# Patient Record
Sex: Male | Born: 1960 | Race: Black or African American | Hispanic: No | Marital: Married | Smoking: Current every day smoker
Health system: Southern US, Community
[De-identification: ages and names within clinical notes are randomized; demographics above are authoritative.]

## PROBLEM LIST (undated history)

## (undated) DIAGNOSIS — I10 Essential (primary) hypertension: Secondary | ICD-10-CM

## (undated) DIAGNOSIS — J449 Chronic obstructive pulmonary disease, unspecified: Secondary | ICD-10-CM

## (undated) DIAGNOSIS — E079 Disorder of thyroid, unspecified: Secondary | ICD-10-CM

## (undated) DIAGNOSIS — E785 Hyperlipidemia, unspecified: Secondary | ICD-10-CM

## (undated) DIAGNOSIS — H269 Unspecified cataract: Secondary | ICD-10-CM

## (undated) DIAGNOSIS — H409 Unspecified glaucoma: Secondary | ICD-10-CM

## (undated) HISTORY — DX: Unspecified glaucoma: H40.9

## (undated) HISTORY — DX: Unspecified cataract: H26.9

## (undated) HISTORY — DX: Hyperlipidemia, unspecified: E78.5

## (undated) HISTORY — DX: Chronic obstructive pulmonary disease, unspecified: J44.9

## (undated) HISTORY — PX: EYE SURGERY: SHX253

---

## 2002-05-20 ENCOUNTER — Emergency Department (HOSPITAL_COMMUNITY): Admission: EM | Admit: 2002-05-20 | Discharge: 2002-05-20 | Payer: Self-pay | Admitting: Emergency Medicine

## 2003-09-18 ENCOUNTER — Ambulatory Visit (HOSPITAL_COMMUNITY): Admission: RE | Admit: 2003-09-18 | Discharge: 2003-09-18 | Payer: Self-pay | Admitting: Ophthalmology

## 2004-08-23 ENCOUNTER — Encounter: Payer: Self-pay | Admitting: Cardiology

## 2004-08-23 ENCOUNTER — Inpatient Hospital Stay (HOSPITAL_COMMUNITY): Admission: EM | Admit: 2004-08-23 | Discharge: 2004-08-26 | Payer: Self-pay | Admitting: *Deleted

## 2004-08-23 ENCOUNTER — Ambulatory Visit: Payer: Self-pay | Admitting: "Endocrinology

## 2004-08-29 ENCOUNTER — Ambulatory Visit: Payer: Self-pay | Admitting: "Endocrinology

## 2004-09-03 ENCOUNTER — Ambulatory Visit: Payer: Self-pay | Admitting: "Endocrinology

## 2004-09-10 ENCOUNTER — Ambulatory Visit (HOSPITAL_COMMUNITY): Admission: RE | Admit: 2004-09-10 | Discharge: 2004-09-10 | Payer: Self-pay | Admitting: "Endocrinology

## 2004-09-19 ENCOUNTER — Ambulatory Visit: Payer: Self-pay | Admitting: "Endocrinology

## 2004-10-21 ENCOUNTER — Ambulatory Visit: Payer: Self-pay | Admitting: "Endocrinology

## 2004-12-23 ENCOUNTER — Ambulatory Visit: Payer: Self-pay | Admitting: "Endocrinology

## 2005-04-21 ENCOUNTER — Ambulatory Visit: Payer: Self-pay | Admitting: "Endocrinology

## 2005-08-19 ENCOUNTER — Ambulatory Visit: Payer: Self-pay | Admitting: "Endocrinology

## 2005-11-19 ENCOUNTER — Ambulatory Visit: Payer: Self-pay | Admitting: "Endocrinology

## 2006-04-08 ENCOUNTER — Ambulatory Visit: Payer: Self-pay | Admitting: "Endocrinology

## 2006-06-27 IMAGING — US US ABDOMEN COMPLETE
1 series · 14 of 25 positions shown · non-contrast
Comparison: None.

CLINICAL DATA: Elevated liver function tests.

ABDOMEN ULTRASOUND
TECHNIQUE: Complete abdominal ultrasound examination was performed including
evaluation of the liver, gallbladder, bile ducts, pancreas, kidneys, spleen,
IVC, and abdominal aorta.

[Series 1: unknown · 0.32mm/px · 14 of 88 slices shown]
[im 1/88]
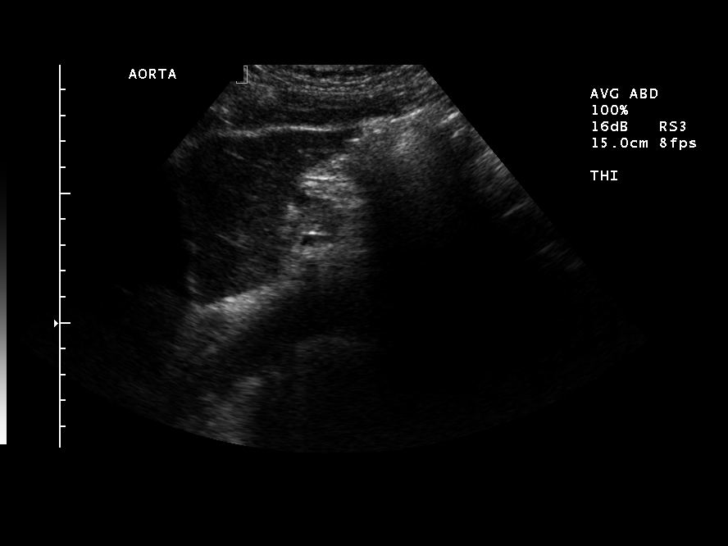
[im 8/88]
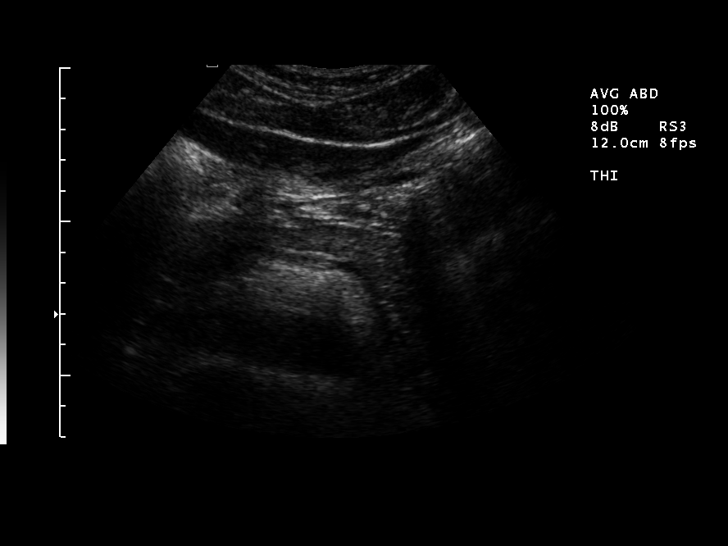
[im 15/88]
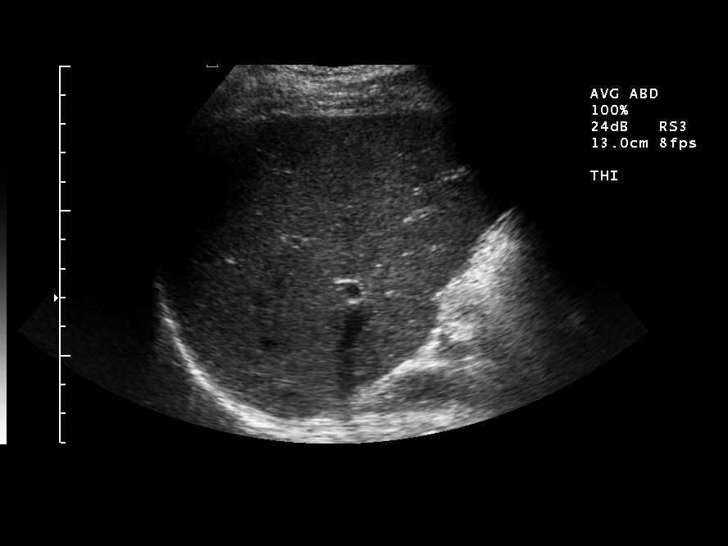
[im 22/88]
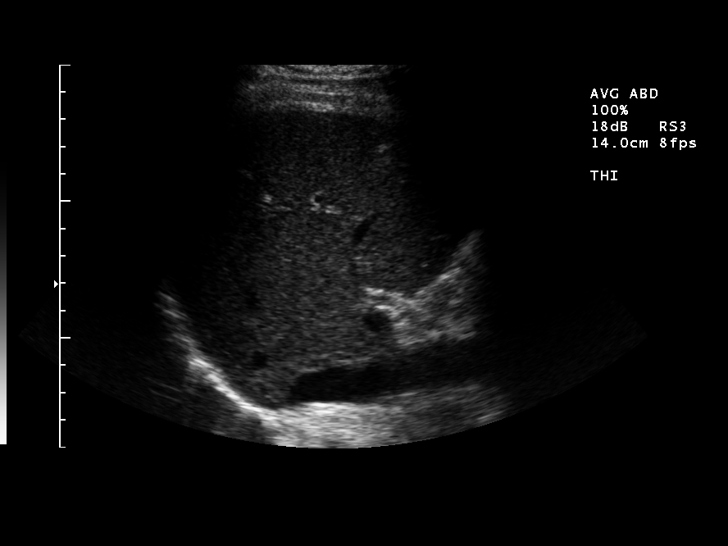
[im 30/88]
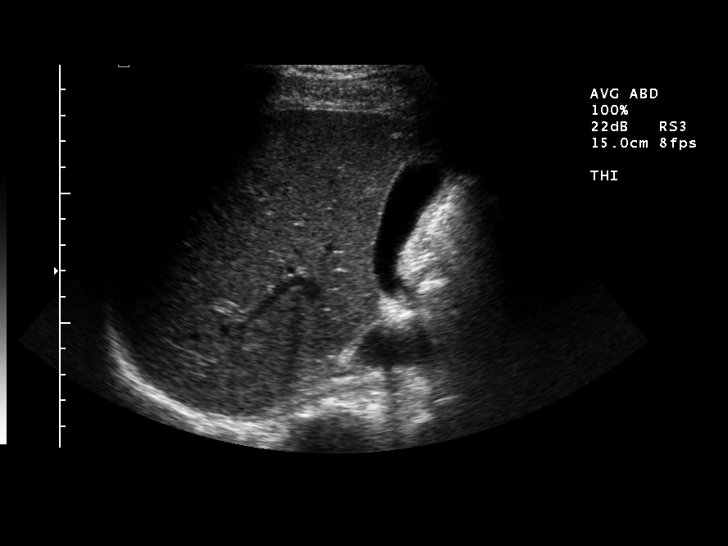
[im 33/88]
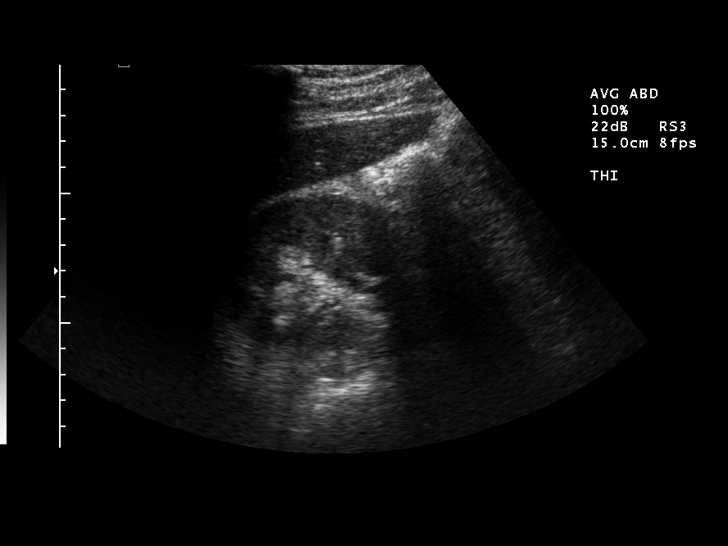
[im 40/88]
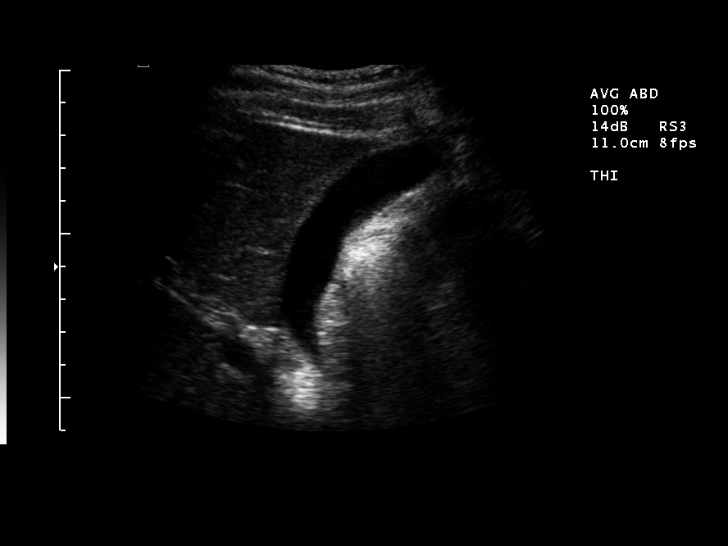
[im 48/88]
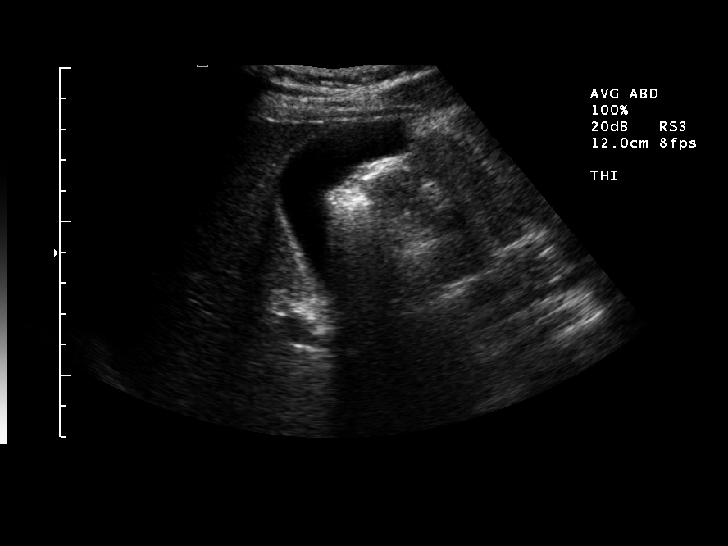
[im 55/88]
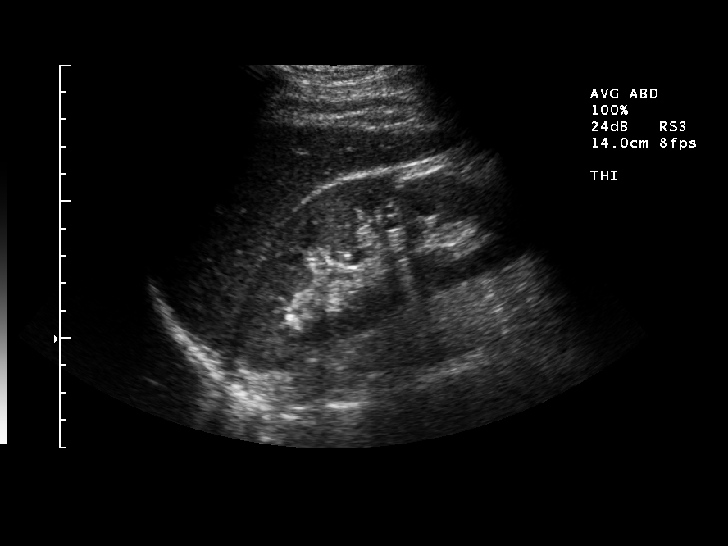
[im 59/88]
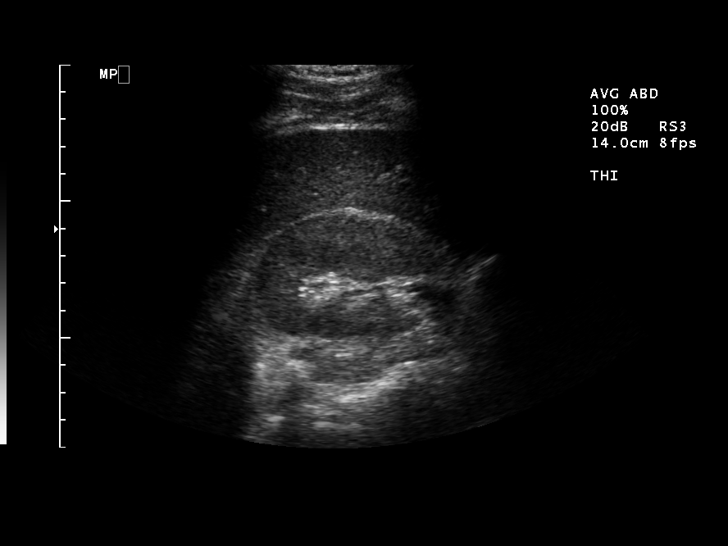
[im 66/88]
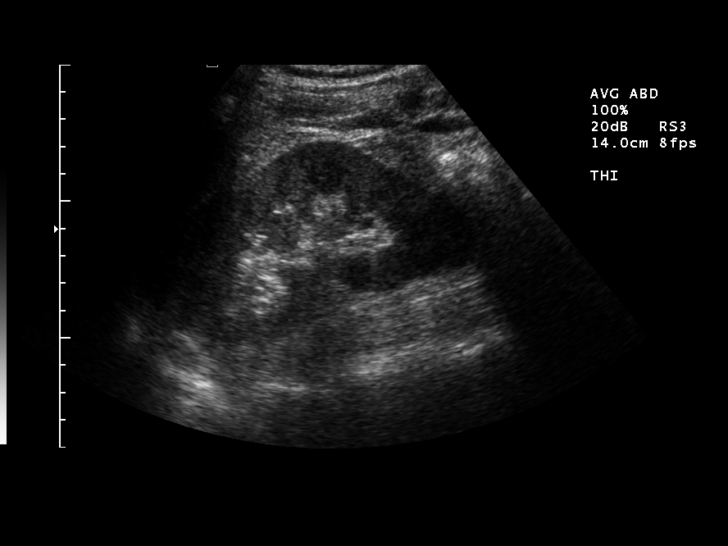
[im 73/88]
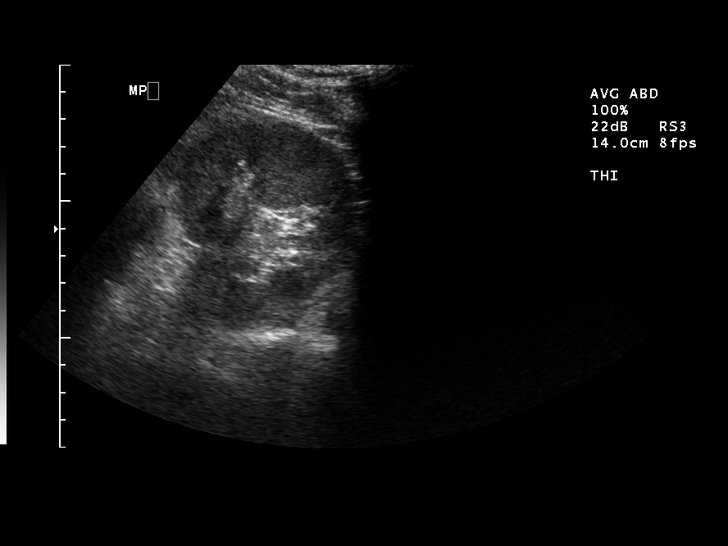
[im 80/88]
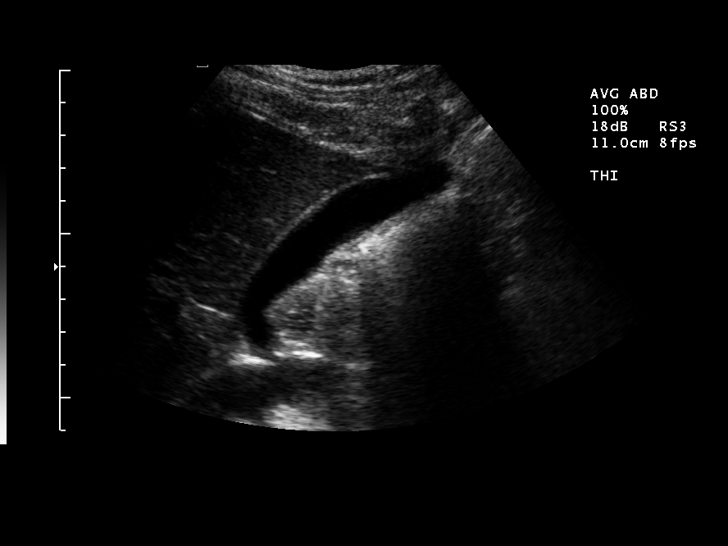
[im 88/88]
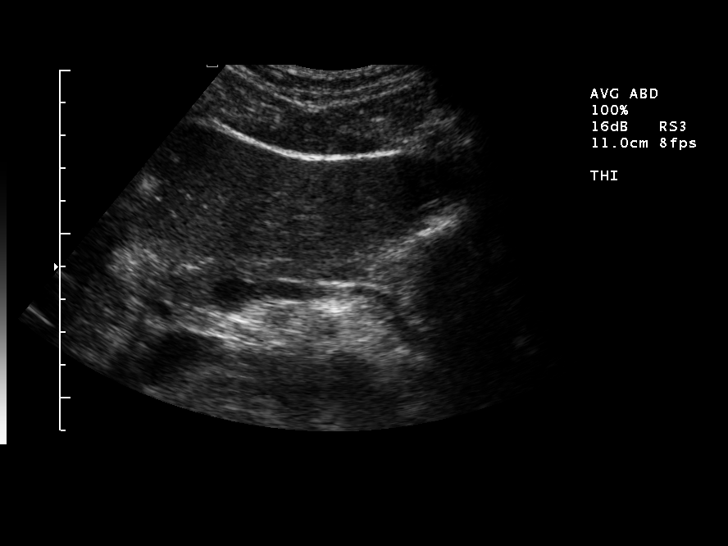

[14 of 25 positions shown; findings below may reference images not displayed]

FINDINGS: Poorly visualized pancreas due to overlying bowel gas. The visualized
portions have normal appearances. Normal appearing liver, gallbladder, spleen,
kidneys, abdominal aorta and inferior vena cava. No gallstones, biliary ductal
dilatation or free peritoneal fluid. The common duct measures 3.3 mm in maximum
diameter.

IMPRESSION

Poor visualization of portions of the pancreas due to overlying bowel gas.
Otherwise, normal examination.

## 2006-12-24 ENCOUNTER — Ambulatory Visit: Payer: Self-pay | Admitting: "Endocrinology

## 2007-06-17 ENCOUNTER — Ambulatory Visit: Payer: Self-pay | Admitting: "Endocrinology

## 2008-10-23 ENCOUNTER — Ambulatory Visit: Payer: Self-pay | Admitting: "Endocrinology

## 2010-10-25 NOTE — H&P (Signed)
NAME:  Troy Newton, SWANDER NO.:  [id number]   MEDICAL RECORD NO.:  [id number]          PATIENT TYPE:  EMS   LOCATION:  ED                           FACILITY:  [facility]   PHYSICIAN:  Melissa L. Ladona Ridgel, MD  DATE OF BIRTH:  1960-08-22   DATE OF ADMISSION:  08/23/2004  DATE OF DISCHARGE:                                HISTORY & PHYSICAL   CHIEF COMPLAINT:  Chest pain.   PRIMARY CARE PHYSICIAN:  [facility].   HISTORY OF PRESENT ILLNESS:  The patient is a 50 year old African-American  male with a recent history of tremor x2 months diagnosed with a high thyroid  according to the patient by [facility] and he was sent to [facility]  Radiology to take a radiation test of which he cannot describe the name. The  patient states around 10 p.m. last night, he was watching television when he  developed the gradual onset of chest discomfort, it was about a 6/10, it was  worse with inspiration, lying back decreased it. He says he can't take deep  breaths because it really hurts when he does that. He says that there is no  radiation of the pain to his arms and no shortness of breath per say but he  just can't take a deep breath. He has had a dry cough for about 3-4 days. He  has had no sick contacts, no nausea, no vomiting.   REVIEW OF SYMPTOMS:  He has had no fever, maybe some chills, but no nausea,  no vomiting, no diarrhea, no musculoskeletal complaints. He has had a tremor  x2, no dysuria, no constipation, all other review of systems are negative.   PAST MEDICAL HISTORY:  What sounds like a thyrotoxicosis. He is taking no  medications at this time although [facility] did give him the nerve pill, he  does not know the name of it. He denies diabetes or hypertension.   PAST SURGICAL HISTORY:  The patient has had glaucoma and required a right  eye surgery.   SOCIAL HISTORY:  He smokes a pack a day. He says he has occasional ethanol  but it sounds like he may be using a little bit  more alcohol than he is  letting on. He states that he has about a shot maybe every couple of days.  He says he has not had any alcohol in two weeks. He said he occasionally  smokes marijuana and denies cocaine use.   FAMILY HISTORY:  He says his brother and sister have thyroid problems. Mom  is dead secondary to an MI, dad is deceased secondary to diabetes.   ALLERGIES:  No known drug allergies.   MEDICATIONS:  He is not taking any medications at this time.   PHYSICAL EXAMINATION:  VITAL SIGNS:  Temperature is 97.8, blood pressure of  150/90, respiratory rate 20, saturations 98%.  GENERAL:  This is a tremulous, well-developed, well-nourished, African-  American male in mild distress secondary to his tremor.  HEENT:  Normocephalic, atraumatic. Pupils round on the left eye but  irregular on the right eye. He has a  history of surgery in that eye. He has  no proptosis. Mucous membranes are moist.  NECK:  Supple. There is a large nodular goiter, left side is greater than  right. He has no bruits.  CHEST:  Clear to auscultation. He has decreased air entry secondary to  decreased inspiration. There is no rhonchi, rales or wheezes.  CARDIOVASCULAR:  He is tachycardic. Positive S1, S2, no S3 or S4. No murmur,  rub or gallop and no pulsus paradoxus is noted.  ABDOMEN:  Soft, minimally tender in the epigastrium. He has positive bowel  sounds and is otherwise nontender.  EXTREMITIES:  Show no cyanosis, clubbing or edema but he does have a tremor.  NEUROLOGIC:  He is hyperreflexive. Cranial nerves II-XII are intact, power  is 5/5, sensation is intact.   LABORATORY DATA:  Reveal a white count of 7.6, hemoglobin of 14.1,  hematocrit is 41.2, platelets of 274.  His sodium is 137, potassium is 4.5,  chloride 106, CO2 is 24, BUN is 11, creatinine 0.7, glucose is 112. AST is  42, ALT is 61, alkaline phosphatase is 141.   Chest x-ray shows bibasilar atelectasis versus pneumonia with low lung   volumes. EKG is 110 with PR depressions. ST elevations in V1, V2, decreased  ST segments in AVR.   ASSESSMENT/PLAN:  This is a 50 year old African-American male who presents  with chest pain, onset gradual since last p.m.  He was recently evaluated  for high thyroid and he states that he took a radiation pill two days ago.  I did get a call off to [facility]. The patient had a  radioactive uptake of 28.6 microcuries of iodine. At 24 hours, he is 48%  consistent with hyperthyroidism.   1.  Cardiovascular tachycardia secondary to thyrotoxicosis. Will start him      on beta blocker after checking his drug screen which has come back at      this time and is negative for cocaine. Will get a stat 2-D echo to rule      out pericarditis and to make sure that there is no signs of tamponade.      At this time clinically, there are no signs of tamponade and we will      titrate up the beta blocker to effect.  2.  Pulmonary.  He has a dry cough. His x-ray has atelectasis secondary to      decreased inspiratory effort. I do not feel that this is pneumonia, at      this time he has not no fever and no white count. I will do incentive      spirometry.  3.  GI.  He has no complaints or issues, will follow him while taking Motrin      to assure that he does not develop any gastrointestinal symptoms.  4.  GU. He has no complaint or issues. Will follow his I's and O's. His      urine drug screen did come back positive for marijuana.  5.  Tobacco abuse. He will get a cessation consult and will use a Nicoderm      patch.  6.  Endocrine, thyrotoxicosis. Will check stat TSH, T3, T4 and start him on      Lopressor 25 mg p.o. q.12 hours and up titrate as needed. He is going to      be on Motrin at this time for symptoms of pericarditis. We may wish to      consider prednisone but  I think at this time the Motrin is appropriate.      I will speak with endocrinology on call to assure that all of his      treatment needs have been met and will arrange for followup as an      outpatient for him.   DVT prophylaxis will be with early ambulation.      MLT/MEDQ  D:  08/23/2004  T:  08/23/2004  Job:  [id number]

## 2010-10-25 NOTE — Op Note (Signed)
NAME:  Troy Newton, Troy Newton NO.:  [id number]   MEDICAL RECORD NO.:  [id number]                   PATIENT TYPE:  OIB   LOCATION:  2899                                 FACILITY:  [facility]   PHYSICIAN:  Salley Scarlet., M.D.         DATE OF BIRTH:  1960/09/13   DATE OF PROCEDURE:  DATE OF DISCHARGE:  09/18/2003                                 OPERATIVE REPORT   PREOPERATIVE DIAGNOSIS:  Glaucoma, poor control, right eye.   POSTOPERATIVE DIAGNOSIS:  Glaucoma, poor control, right eye.   OPERATION:  Trabeculectomy.   ANESTHESIA:  Local using Xylocaine 2% with Marcaine 0.75% Wydase.   JUSTIFICATION FOR PROCEDURE:  This is a 50 year old gentleman who has a  history of chronic uveitis.  He has been treated for the past several weeks  for chronic uveitis, during which time he developed a severe glaucoma  ranging from 40 to 50.  Glaucoma medications have not been sufficient to  bring the pressures down to within acceptable ranges.  This patient was  counseled as to the nature of his problems and trabeculectomy was  recommended.  He was admitted at this time for that purpose.  He has a  visual acuity best corrected to 20/40 on the right, 20/20 on the left.  His  intraocular pressure is recorded as 46 on the right, 12 on the left.   PROCEDURE:  Under the influence of IV sedation a Van Lint retrobulbar  anesthesia was given.  The patient was then prepped and draped in the usual  manner.  The lid speculum was inserted in the upper and lower lids of the  right eye.  A 4-0 silk traction suture was passed with the belly of the  superior rectus muscle on traction.  A limbal-based conjunctival flap was  turned and hemostasis achieved by using the cautery.  An angular scleral  flap was fashioned in the right superior-medial quadrant.  The base of the  triangle was handed up the limbus.  After fashioning the flap a Weck-Cell  pledget soaked in Mitomycin C was allowed to  sit on the sclera beneath the  conjunctiva for approximately two minutes.  The Mitomycin C was then  thoroughly irrigated from the eye.  The triangular scleral flap was then  dissected down in the inferior cornea using a crescent blade.  From beneath  this flap a 3 x 3 section of tissue was removed, thereby entering the  anterior chamber.  A periphery iridectomy was made in the iris.  _________  was injected into the eye to deepen anterior chamber.  Miochol was injected  to constrict the pupil.  The triangular flap was then tacked down to the  sclera at two of his apices.   After it was ascertained that the wound was air tight and watertight, it  could be seen that fluid could escape through scleral opening.  The  conjunctiva and Tenon's capsule were closed  over the sclera in layers using  a running suture of 7-0 Vicryl for both the Tenon's and the conjunctiva.  One cc of Celestone and 0.5 cc of gentamicin were injected  subconjunctivally.  Maxitrol drops and Atropine drops were applied along  with the patch and Fox shield.  The patient tolerated the procedure well and  was discharged to the postanesthesia recovery room in satisfactory  condition.  He was instructed to rest today, to take Vicodin every four  hours as needed for pain, and to see me tomorrow for further evaluation.   DISCHARGE DIAGNOSES:  1. Glaucoma, right poor, poorly controlled.  2. Chronic iritis.                                               Salley Scarlet., M.D.    TB/MEDQ  D:  09/30/2003  T:  10/01/2003  Job:  [id number]

## 2010-10-25 NOTE — Discharge Summary (Signed)
NAME:  NICKO, DAHER NO.:  [id number]   MEDICAL RECORD NO.:  [id number]          PATIENT TYPE:  INP   LOCATION:  0357                         FACILITY:  [facility]   PHYSICIAN:  Kela Millin, M.D.DATE OF BIRTH:  1961-01-26   DATE OF ADMISSION:  08/23/2004  DATE OF DISCHARGE:  08/26/2004                                 DISCHARGE SUMMARY   PREOPERATIVE DIAGNOSES:  1.  Hyperthyroidism, likely secondary to Graves' disease, per      endocrinologist.  2.  Pericarditis.  3.  A combination of Graves' disease and Hashimoto's.  4.  Elevated liver function tests, likely secondary to hyperthyroidism, per      endocrinology.  5.  Anxiety, mostly due to hyperthyroidism.  6.  Marijuana abuse.   CONSULTATIONS:  1.  Cardiology.  2.  Endocrinology, Dr. Fransico Michael.   STUDIES:  1.  Echocardiogram:  Overall, left ventricular systolic function normal.  EF      55-65%.  No left ventricular regional wall motion abnormalities.  No      pericardial effusion.  2.  Abdominal ultrasound:  Liver normal appearance.  Poor visualization of      pancreas due to overlying bowel gas, otherwise normal exam.   HISTORY:  The patient is a 50 year old black male with a history of tremor  x2 months, diagnosed with high thyroid __________ to the patient by [facility], and he was sent to [facility] to do radiation test, the  name he could not remember.  The patient stated that at about 10 p.m. on the  night prior to presentation, he was watching TV when he developed gradual  onset of chest discomfort, 6/10 in intensity.  It was gradual in onset, and  it was worse with inspiration.  Lying back decreased the pain.  He reported  that he could not take a deep breath because it hurt when he did that.  The  patient stated that there was no radiation of pain.  He also admitted to a  dry cough for about 3-4 days.  He denied any sick contacts.   PHYSICAL EXAMINATION:  VITAL SIGNS:  On  admission, per Dr. Ladona Ridgel, revealed  a temperature of 97.8 with a blood pressure of 160/90.  Respiratory rate 20.  O2 sat 98%.  GENERAL:  He was a tremulous, well-developed and well-nourished black male  in mild distress secondary to the tremor and the pertinent findings on exam  were:  CARDIOVASCULAR:  He was tachycardic with normal S1 and S2.  No S3 or S4.  There was no murmur, rub, or gallop.  Also, no pulse paradoxes noted.  LUNGS:  Clear to auscultation.  He was noted to have decreased air entry  secondary to poor inspiratory effort but no rhonchi, crackles, or wheezes.  EXTREMITIES:  He was noted to have a tremor.  NEUROLOGIC:  He was noted to be hyperreflexic.  Cranial nerves II-XII were  intact, and sensory was grossly intact.   LABORATORY DATA:  His white cell count was 7.6, hemoglobin 14.1, hematocrit  41.2, platelet count 274.  Sodium 137, potassium  4.5, chloride 106, CO2 24,  BUN 11, creatinine 0.7, glucose 112.  AST 42, ALT 61, alkaline phosphatase  141.   PROBLEMS:  Problem 1.  Hyperthyroidism with tachycardia secondary to thyroid  toxicosis:  Upon admission, the patient had a T3 uptake done, and that was  46.8, a free T4 of 5.3, TSH decreased at 0.004, and a T3 greater than 800.  Upon admission, the patient was discussed with the endocrinologist, and he  was started on PTU and also on beta blockers.  The metoprolol dose was  increased for better heart rate control to 50 mg p.o. q.6h.  Dr. Fransico Michael saw  the patient on August 26, 2004 and agreed with the dosage of his PTU as well  as beta blockers and ordered some more tests, including TSI thyroid  stimulating immunoglobulin, thyroid peroxidase antibody.  His opinion was  that the patient likely had Graves' disease and that it could be due to  toxic nodules or flareup of Hashimoto's disease or the patient's family  history.  He also stated that the patient could well have the combination of  Graves' disease and Hashimoto's.   Dr. Fransico Michael also agreed with discharging  the patient and having him follow up with him in the office as scheduled.  The patient will be discharged on PTU as well as metoprolol.   Problem 2.  Pericarditis:  The patient presented with chest pain and workup  was consistent with pericarditis.  Cardiology saw the patient, and an  echocardiogram was done, and the results are as stated above.  The patient  was started on Motrin, and his symptoms resolved.  The echo was negative for  pericardial effusion, as stated above.  The patient is to follow up with his  primary care physician upon discharge and cardiology as needed.   Problem 3.  Elevated LFTs:  The patient had an ultrasound done, and this was  within normal limits.  Also had a hepatitis panel done, and it was negative.  Per endocrinology, this is likely secondary to hyperthyroidism, per say.   DISCHARGE MEDICATIONS:  1.  Motrin 400 mg p.o. t.i.d. p.r.n.  2.  Lopressor 50 mg p.o. q.6h.  3.  PTU 200 mg p.o. q.i.d.   FOLLOW-UP CARE:  1.  Primary care physician in one week.  2.  Dr. Fransico Michael, endocrinologist, this week as scheduled.   DISCHARGE CONDITION:  Improved, stable.      ACV/MEDQ  D:  08/26/2004  T:  08/26/2004  Job:  [id number]   cc:   [facility]   Dr. Alvan Dame

## 2010-12-27 ENCOUNTER — Other Ambulatory Visit: Payer: Self-pay | Admitting: "Endocrinology

## 2011-02-26 ENCOUNTER — Emergency Department (HOSPITAL_COMMUNITY)
Admission: EM | Admit: 2011-02-26 | Discharge: 2011-03-01 | Disposition: A | Discharge status: Home / Self Care | Payer: Self-pay | Attending: Internal Medicine | Admitting: Internal Medicine

## 2011-02-26 DIAGNOSIS — R358 Other polyuria: Secondary | ICD-10-CM | POA: Insufficient documentation

## 2011-02-26 DIAGNOSIS — Z79899 Other long term (current) drug therapy: Secondary | ICD-10-CM | POA: Insufficient documentation

## 2011-02-26 DIAGNOSIS — R42 Dizziness and giddiness: Secondary | ICD-10-CM | POA: Insufficient documentation

## 2011-02-26 DIAGNOSIS — R3589 Other polyuria: Secondary | ICD-10-CM | POA: Insufficient documentation

## 2011-02-26 DIAGNOSIS — E039 Hypothyroidism, unspecified: Secondary | ICD-10-CM | POA: Insufficient documentation

## 2011-02-26 DIAGNOSIS — R5381 Other malaise: Secondary | ICD-10-CM | POA: Insufficient documentation

## 2011-02-26 DIAGNOSIS — I10 Essential (primary) hypertension: Secondary | ICD-10-CM | POA: Insufficient documentation

## 2011-02-26 LAB — POCT I-STAT, CHEM 8
Chloride: 104 mEq/L (ref 96–112)
Potassium: 3.8 mEq/L (ref 3.5–5.1)

## 2011-06-16 ENCOUNTER — Ambulatory Visit: Payer: Self-pay

## 2012-01-11 ENCOUNTER — Encounter (HOSPITAL_COMMUNITY): Payer: Self-pay | Admitting: *Deleted

## 2012-01-11 ENCOUNTER — Emergency Department (HOSPITAL_COMMUNITY)
Admission: EM | Admit: 2012-01-11 | Discharge: 2012-01-11 | Disposition: A | Discharge status: Home / Self Care | Payer: Self-pay | Attending: Emergency Medicine | Admitting: Emergency Medicine

## 2012-01-11 DIAGNOSIS — I10 Essential (primary) hypertension: Secondary | ICD-10-CM | POA: Insufficient documentation

## 2012-01-11 DIAGNOSIS — F17200 Nicotine dependence, unspecified, uncomplicated: Secondary | ICD-10-CM | POA: Insufficient documentation

## 2012-01-11 DIAGNOSIS — R040 Epistaxis: Secondary | ICD-10-CM | POA: Insufficient documentation

## 2012-01-11 DIAGNOSIS — E079 Disorder of thyroid, unspecified: Secondary | ICD-10-CM | POA: Insufficient documentation

## 2012-01-11 HISTORY — DX: Essential (primary) hypertension: I10

## 2012-01-11 HISTORY — DX: Disorder of thyroid, unspecified: E07.9

## 2012-01-11 NOTE — ED Notes (Addendum)
Pt reports having intermittent nosebleed right nare x 3 days. Hx of htn, bp 137/92. No bleeding noted at triage

## 2012-01-11 NOTE — ED Notes (Signed)
AIDET performed. 

## 2012-01-11 NOTE — ED Provider Notes (Signed)
History    Scribed for Hurman Horn, MD, the patient was seen in room TR08C/TR08C. This chart was scribed by Katha Cabal.   CSN: [id number]  Arrival date & time 01/11/12  1205   None     Chief Complaint  Patient presents with  . Epistaxis    (Consider location/radiation/quality/duration/timing/severity/associated sxs/prior treatment) HPI Hurman Horn, MD entered patient's room at 12:45 PM   Troy Newton is a 51 y.o. male who presents to the Emergency Department complaining of intermittent right sided nosebleed for past few days.   Symptoms are not associated with cough, sore throat, dental pain, lightheadedness, dizziness or syncope.  Denies vomiting, diarrhea, abdominal pain or chest pain.  Patient with history of hypertension and thyroid disease and is compliant with medication.   Patient does not take blood thinners.  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     PCP Provider Not In System         Past Medical History  Diagnosis Date  . Hypertension   . Thyroid disease     History reviewed. No pertinent past surgical history.  History reviewed. No pertinent family history.  History  Substance Use Topics  . Smoking status: Current Everyday Smoker    Types: Cigarettes  . Smokeless tobacco: Not on file  . Alcohol Use: No      Review of Systems 10 Systems reviewed and all are negative for acute change except as noted in the HPI.   Allergies  Review of patient's allergies indicates no known allergies.  Home Medications   Current Outpatient Rx  Name Route Sig Dispense Refill  . LEVOTHYROXINE SODIUM 125 MCG PO TABS Oral Take 125 mcg by mouth daily.    Marland Kitchen LISINOPRIL 20 MG PO TABS Oral Take 20 mg by mouth daily.    Marland Kitchen METOPROLOL TARTRATE 50 MG PO TABS Oral Take 50 mg by mouth 2 (two) times daily.    Marland Kitchen NAPROXEN SODIUM 220 MG PO TABS Oral Take 220 mg by mouth 2 (two) times daily as needed. For pain      BP 137/92   Pulse 76  Temp 98.1 F (36.7 C) (Oral)  Resp 20  SpO2 98%  Physical Exam  Nursing note and vitals reviewed. Constitutional:       Awake, alert, nontoxic appearance.  HENT:  Head: Atraumatic.       Left nares: Nlm, Right nares : tiny bleed area in septal mucosa , no active bleeding  Eyes: Right eye exhibits no discharge. Left eye exhibits no discharge.  Neck: Neck supple.  Cardiovascular: Normal rate and regular rhythm.   Pulmonary/Chest: Effort normal and breath sounds normal. No respiratory distress. He exhibits no tenderness.  Abdominal: Soft. There is no tenderness. There is no rebound.  Musculoskeletal: He exhibits no tenderness.       Baseline ROM, no obvious new focal weakness.  Neurological:       Mental status and motor strength appears baseline for patient and situation.  Skin: No rash noted.  Psychiatric: He has a normal mood and affect.    ED Course  Procedures (including critical care time) Time-out taken, right naris septal mucosa cauterized with AgNO3 Pt tolerated well, has pre-existing apparent septal perf which did not occur from cautery today, Pt tolerated well, no bleeding in ED  DIAGNOSTIC STUDIES: Oxygen Saturation is 98% on room air, normal by my interpretation.     COORDINATION OF CARE: 12:51 PM  Physical exam complete.      LABS / RADIOLOGY:   Labs Reviewed - No data to display No results found.       MDM             MEDICATIONS GIVEN IN THE E.D. Scheduled Meds:   Continuous Infusions:       IMPRESSION: 1. Right-sided epistaxis      NEW MEDICATIONS: New Prescriptions   No medications on file     I personally performed the services described in this documentation, which was scribed in my presence. The recorded information has been reviewed and considered.  Patient / Family / Caregiver informed of clinical course, understand medical decision-making process, and agree with plan.       Hurman Horn,  MD 01/17/12 [phone]

## 2014-11-11 ENCOUNTER — Encounter (HOSPITAL_COMMUNITY): Payer: Self-pay | Admitting: Emergency Medicine

## 2014-11-11 ENCOUNTER — Emergency Department (HOSPITAL_COMMUNITY)
Admission: EM | Admit: 2014-11-11 | Discharge: 2014-11-12 | Discharge status: Against Medical Advice | Payer: BLUE CROSS/BLUE SHIELD | Attending: Emergency Medicine | Admitting: Emergency Medicine

## 2014-11-11 DIAGNOSIS — Z720 Tobacco use: Secondary | ICD-10-CM | POA: Diagnosis not present

## 2014-11-11 DIAGNOSIS — I10 Essential (primary) hypertension: Secondary | ICD-10-CM | POA: Insufficient documentation

## 2014-11-11 DIAGNOSIS — Z760 Encounter for issue of repeat prescription: Secondary | ICD-10-CM | POA: Diagnosis present

## 2014-11-11 NOTE — ED Notes (Signed)
Pt has been out of his hyperthyroid medicine and  Hypertension medication.  Pt has been out of the medication for three weeks

## 2014-11-19 ENCOUNTER — Encounter (HOSPITAL_COMMUNITY): Payer: Self-pay

## 2014-11-19 ENCOUNTER — Emergency Department (HOSPITAL_COMMUNITY)
Admission: EM | Admit: 2014-11-19 | Discharge: 2014-11-19 | Disposition: A | Discharge status: Home / Self Care | Payer: BLUE CROSS/BLUE SHIELD | Attending: Emergency Medicine | Admitting: Emergency Medicine

## 2014-11-19 DIAGNOSIS — Z760 Encounter for issue of repeat prescription: Secondary | ICD-10-CM | POA: Insufficient documentation

## 2014-11-19 DIAGNOSIS — Z720 Tobacco use: Secondary | ICD-10-CM | POA: Insufficient documentation

## 2014-11-19 DIAGNOSIS — E079 Disorder of thyroid, unspecified: Secondary | ICD-10-CM | POA: Diagnosis not present

## 2014-11-19 DIAGNOSIS — I10 Essential (primary) hypertension: Secondary | ICD-10-CM | POA: Insufficient documentation

## 2014-11-19 DIAGNOSIS — Z79899 Other long term (current) drug therapy: Secondary | ICD-10-CM | POA: Diagnosis not present

## 2014-11-19 MED ORDER — LISINOPRIL 20 MG PO TABS: 20.0000 mg | ORAL_TABLET | Freq: Every day | ORAL | Status: DC

## 2014-11-19 MED ORDER — LEVOTHYROXINE SODIUM 125 MCG PO TABS: 125.0000 ug | ORAL_TABLET | Freq: Every day | ORAL | Status: AC

## 2014-11-19 MED ORDER — METOPROLOL TARTRATE 50 MG PO TABS: 50.0000 mg | ORAL_TABLET | Freq: Two times a day (BID) | ORAL | Status: AC

## 2014-11-19 NOTE — ED Provider Notes (Signed)
CSN: [id number]     Arrival date & time 11/19/14  1146 History  This chart was scribed for non-physician practitioner, Arthor Captain PA,  working with Blake Divine, MD, by Tanda Rockers, ED Scribe. This patient was seen in room WTR9/WTR9 and the patient's care was started at 1:34 PM.    Chief Complaint  Patient presents with  . Medication Refill   The history is provided by the patient. No language interpreter was used.     HPI Comments: Troy Newton is a 54 y.o. male with hx HTN and Hypothyroidism who presents to the Emergency Department for medication refill. Pt has been out of Lisinopril, Metoprolol, and Synthroid for the past 3 weeks. Pt states he had a PCP but believes he moved and cannot find him. Pt has no complaints at this time.   Past Medical History  Diagnosis Date  . Hypertension   . Thyroid disease    Past Surgical History  Procedure Laterality Date  . Eye surgery     History reviewed. No pertinent family history. History  Substance Use Topics  . Smoking status: Current Every Day Smoker -- 0.50 packs/day    Types: Cigarettes  . Smokeless tobacco: Never Used  . Alcohol Use: No    Review of Systems  Constitutional: Negative for fever and chills.  HENT: Negative for congestion and rhinorrhea.   Respiratory: Negative for cough and shortness of breath.   Cardiovascular: Negative for chest pain.  Gastrointestinal: Negative for nausea and vomiting.  Musculoskeletal: Negative for myalgias and arthralgias.  Skin: Negative for rash.  Neurological: Negative for weakness and numbness.      Allergies  Review of patient's allergies indicates no known allergies.  Home Medications   Prior to Admission medications   Medication Sig Start Date End Date Taking? Authorizing Provider  levothyroxine (SYNTHROID, LEVOTHROID) 125 MCG tablet Take 125 mcg by mouth daily.    Historical Provider, MD  lisinopril (PRINIVIL,ZESTRIL) 20 MG tablet Take 20 mg by mouth daily.     Historical Provider, MD  metoprolol (LOPRESSOR) 50 MG tablet Take 50 mg by mouth 2 (two) times daily.    Historical Provider, MD  naproxen sodium (ANAPROX) 220 MG tablet Take 220 mg by mouth 2 (two) times daily as needed (pain). For pain    Historical Provider, MD  Pseudoeph-Doxylamine-DM-APAP (NYQUIL PO) Take 30 mLs by mouth daily as needed (sleep).    Historical Provider, MD   Triage Vitals: BP 174/104 mmHg  Pulse 80  Temp(Src) 97.5 F (36.4 C) (Oral)  Resp 20  Ht 5' 4.5" (1.638 m)  Wt 162 lb (73.483 kg)  BMI 27.39 kg/m2  SpO2 99%   Physical Exam  Constitutional: He is oriented to person, place, and time. He appears well-developed and well-nourished. No distress.  HENT:  Head: Normocephalic and atraumatic.  Eyes: Conjunctivae and EOM are normal.  Neck: Neck supple. No tracheal deviation present.  Cardiovascular: Normal rate.   Pulmonary/Chest: Effort normal. No respiratory distress.  Musculoskeletal: Normal range of motion.  Neurological: He is alert and oriented to person, place, and time.  Skin: Skin is warm and dry.  Psychiatric: He has a normal mood and affect. His behavior is normal.  Nursing note and vitals reviewed.   ED Course  Procedures (including critical care time)  DIAGNOSTIC STUDIES: Oxygen Saturation is 99% on RA, normal by my interpretation.    COORDINATION OF CARE: 1:37 PM-Discussed treatment plan which includes RX Lisinopril, Metoprolol, and Synthroid as well as referral for  [place] [facility] with pt at bedside and pt agreed to plan.   Labs Review Labs Reviewed - No data to display  Imaging Review No results found.   EKG Interpretation None      MDM   Final diagnoses:  Medication refill   Patient here for medication refill. He is given referral to PCP. Hypertension. No neuro complaints or chest pain. Her safe for discharge at this time.  I personally performed the services described in this documentation, which was scribed in my  presence. The recorded information has been reviewed and is accurate.        Arthor Captain, PA-C 11/19/14 2146  Blake Divine, MD 11/20/14 2020

## 2014-11-19 NOTE — ED Notes (Signed)
Patient wants medication refilled-Lisinopril, metoprolol, synthroid.

## 2014-11-19 NOTE — Discharge Instructions (Signed)
Medication Refill, Emergency Department We have refilled your medication today as a courtesy to you. It is best for your medical care, however, to take care of getting refills done through your primary caregiver's office. They have your records and can do a better job of follow-up than we can in the emergency department. On maintenance medications, we often only prescribe enough medications to get you by until you are able to see your regular caregiver. This is a more expensive way to refill medications. In the future, please plan for refills so that you will not have to use the emergency department for this. Thank you for your help. Your help allows [place] to better take care of the daily emergencies that enter our department. Document Released: 09/12/2003 Document Revised: 08/18/2011 Document Reviewed: 09/02/2013 [facility] Patient Information 2015 [place], [place]. This information is not intended to replace advice given to you by your health care provider. Make sure you discuss any questions you have with your health care provider.   Please call the number below to find a doctor   No Primary Care Doctor: - Call [facility] [phone] - can help you locate a primary care doctor that  accepts your insurance, provides certain services, etc. - Physician Referral Service- [phone] -    RESOURCE GUIDE  Chronic Pain Problems: Contact [facility]  [phone] Patients need to be referred by their primary care doctor.  Insufficient Money for Medicine: Contact United Way:  call "211" or [facility] [phone].   Agencies that provide inexpensive medical care: [facility]  [phone] - [facility]  [phone] - [facility]  [phone] [facility]  [phone] - Planned Parenthood  [phone] [facility]  [phone]  Medicaid-accepting [facility] Providers: - [facility]- [address]  [phone], Mon-Fri 9am-7pm, Sat 9am-1pm - [facility]- [address] [place], [address] [place]  [phone] - [facility]- [address] [place]  [phone] [facility]- [address]  [phone] - Renaye Rakers- [address] [place], [address], [phone]  Only accepts [place] patients after they have their name  applied to their card  Self Pay (no insurance) in [place]: - Sickle Cell Patients: Dr Willey Blade, [facility]  [address] [place], [phone] - [facility]- [address] [place]  [phone]       [facility] [place]- [address]       -     [facility]- see information above (Speak to [employer] if you do not have insurance)       -  [facility]- [address] [place], [phone]       -  [facility]- [address],  [phone]       -  [facility]- [address], [phone]       -  Dr Julio Sicks-  [address], [place], [phone]       -  [facility]- [address] [phone]       -  [facility]- [address] [phone], also [address], [phone]       -    [facility]- [phone]  [address], [phone], 1st & 3rd Saturday   every month, 10am-1pm  1) Find a Doctor and Pay Out of Pocket Although you won't have to find out who is covered by your insurance plan, it is a good idea to ask around and get recommendations. You will then need to call the office and see if the doctor you have chosen will accept you as a new patient and what types of options they offer for patients who are self-pay. Some doctors offer discounts or will set up payment plans for their patients who do not have insurance, but you will need to ask so you aren't surprised when you get to your appointment.  2) Contact Your Local Health Department Not all  health departments have doctors that can see patients for sick visits, but many do, so it is worth a call to see if yours does. If you don't know where your local health department is, you can check in your phone book. The CDC also has a tool to help you locate your state's health department, and many state websites also have listings of all of their local health departments.  3) Find a Walk-in Clinic If your illness is not likely to be very severe or complicated, you may want to try a walk in clinic. These are popping up all over the country in pharmacies, drugstores, and shopping centers. They're usually staffed by nurse practitioners or physician assistants that have been trained to treat common illnesses and complaints. They're usually fairly quick and inexpensive. However, if you have serious medical issues or chronic medical problems, these are probably not your best option  STD Testing - [facility] Department of [facility] [place], STD Clinic, [address] [place], phone [phone] or [phone].  Monday - Friday, call for an appointment. [facility] Department of [employer], STD Clinic, [address], [place], phone [phone] or [phone].  Monday - Friday, call for an appointment.  Abuse/Neglect: [facility] Child Abuse Hotline 210-620-1573 [facility] Child Abuse Hotline [phone] (After Hours)  Emergency Shelter:  [facility] (347)[phone]  Maternity Homes: - Room at the [place] [facility] [phone] - [facility] (669)[phone]  MRSA Hotline #:   601-144-1666  [facility] Resources  [facility]  United Way [facility] Dept. [address]                 [address]         [address] [place] [address]  Blondell Reveal Phone:  [phone]                                   Phone:  [phone]                   Phone:  [phone]  [facility] Mental Health, [phone] - [facility] - [phone] [phone]       -  [facility] in [place], [address],                                  [phone], [facility] Child Abuse Hotline 863-512-6075 or [phone] (After Hours)   Behavioral Health Services  Substance Abuse Resources: - [facility]  236-702-0906 - [facility] (919)633-0281 - [facility] [place] (236)[phone] Floydene Flock [phone] - Residential & Outpatient Substance Abuse Program  [phone]  Psychological Services: [facility]  [phone] [facility]  ([phone] - [facility], [phone] [place]. [address], [place], ACCESS LINE: [phone] or ([phone], [employer]  Dental Assistance  If unable to pay or uninsured, contact:  [facility] or [facility]. to become qualified for the adult dental clinic.  Patients with Medicaid: [facility] [phone] [address], [phone] [address] [phone]  If unable to pay, or uninsured, contact [facility] [phone]) or [facility] Department [phone] in [place], [phone] in [facility]) to become qualified for the adult dental clinic   Other Low-Cost Community Dental Services: - [facility]- [address] [place], [place], [place], [address], [phone], [address]. [phone], 2nd and 4th Thursday of the month at 6:30am.  10 clients each day by appointment, can sometimes see walk-in patients if someone does not show for an appointment. [facility]- [address] [place], [place], [address], [phone] - [facility]- [address] [place], [place], [address], [phone] - [facility]-  [phone] [facility]- [phone] [place] Department- [phone]

## 2015-01-23 ENCOUNTER — Emergency Department (HOSPITAL_COMMUNITY)
Admission: EM | Admit: 2015-01-23 | Discharge: 2015-01-23 | Disposition: A | Discharge status: Home / Self Care | Payer: BLUE CROSS/BLUE SHIELD | Source: Home / Self Care

## 2015-01-23 ENCOUNTER — Encounter (HOSPITAL_COMMUNITY): Payer: Self-pay | Admitting: Emergency Medicine

## 2015-01-23 DIAGNOSIS — F17210 Nicotine dependence, cigarettes, uncomplicated: Secondary | ICD-10-CM

## 2015-01-23 DIAGNOSIS — I10 Essential (primary) hypertension: Secondary | ICD-10-CM | POA: Diagnosis not present

## 2015-01-23 DIAGNOSIS — E039 Hypothyroidism, unspecified: Secondary | ICD-10-CM

## 2015-01-23 LAB — POCT I-STAT, CHEM 8
BUN: 15 mg/dL (ref 6–20)
CALCIUM ION: 1.24 mmol/L — AB (ref 1.12–1.23)
CREATININE: 1 mg/dL (ref 0.61–1.24)
Chloride: 106 mmol/L (ref 101–111)
Glucose, Bld: 85 mg/dL (ref 65–99)
HEMATOCRIT: 49 % (ref 39.0–52.0)
HEMOGLOBIN: 16.7 g/dL (ref 13.0–17.0)
Potassium: 4.1 mmol/L (ref 3.5–5.1)
Sodium: 142 mmol/L (ref 135–145)
TCO2: 25 mmol/L (ref 0–100)

## 2015-01-23 LAB — TSH: TSH: 13.261 u[IU]/mL — ABNORMAL HIGH (ref 0.350–4.500)

## 2015-01-23 NOTE — ED Notes (Signed)
Final report of TSH available for review, provider advised

## 2015-01-23 NOTE — ED Provider Notes (Signed)
CSN: [id number]     Arrival date & time 01/23/15  1350 History   None    Chief Complaint  Patient presents with  . 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 Pt denies any CP,SOB,palpitations or heat/cold intolerances. Reviewed scripts date filled 11/19/2014 ( 1 month supply) with 1 refill, pt states he didn't realize he had a refill on his meds and wants lab work checked anyway; states his "doctor told me to skip Wed and Sundays on my meds". Advised pt to not skip doses, must take daily, and stressed importance of regular PCP for management of chronic health needs. 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 Pt verbalized understanding to this provider.   Patient is a 54 y.o. male presenting with hypertension. The history is provided by the patient. No language interpreter was used.  Hypertension This is a chronic problem. The current episode started more than 1 week ago. Pertinent negatives include no chest pain, no abdominal pain, no headaches and no shortness of breath. The symptoms are relieved by medications.    Past Medical History  Diagnosis Date  . Hypertension   . Thyroid disease    Past Surgical History  Procedure Laterality Date  . Eye surgery     History reviewed. No pertinent family history. Social History  Substance Use Topics  . Smoking status: Current Every Day Smoker -- 0.50 packs/day    Types: Cigarettes  . Smokeless tobacco: Never Used  . Alcohol Use: No    Review of Systems  Respiratory: Negative for shortness of breath.   Cardiovascular: Negative for chest pain.  Gastrointestinal:  Negative for abdominal pain.  Neurological: Negative for headaches.    Allergies  Review of patient's allergies indicates no known allergies.  Home Medications   Prior to Admission medications   Medication Sig Start Date End Date Taking? Authorizing Provider  levothyroxine (SYNTHROID, LEVOTHROID) 125 MCG tablet Take 1 tablet (125 mcg total) by mouth daily. 11/19/14   Arthor Captain, PA-C  lisinopril (PRINIVIL,ZESTRIL) 20 MG tablet Take 1 tablet (20 mg total) by mouth daily. 11/19/14   Arthor Captain, PA-C  metoprolol (LOPRESSOR) 50 MG tablet Take 1 tablet (50 mg total) by mouth 2 (two) times daily. 11/19/14   Arthor Captain, PA-C  naproxen sodium (ANAPROX) 220 MG tablet Take 220 mg by mouth 2 (two) times daily as needed (pain). For pain    Historical Provider, MD  Pseudoeph-Doxylamine-DM-APAP (NYQUIL PO) Take 30 mLs by mouth daily as needed (sleep).    Historical Provider, MD   BP 160/110 mmHg  Pulse 78  Temp(Src) 98.4 F (36.9 C) (Oral)  Resp 16  SpO2 99% Physical Exam  ED Course  Procedures (including critical care time) Labs Review Labs Reviewed  POCT I-STAT, CHEM 8 - Abnormal; Notable for the following:    Calcium, Ion 1.24 (*)    All other components within normal limits  TSH   Results for orders placed or performed during the hospital encounter of 01/23/15  I-STAT, chem 8  Result Value Ref Range   Sodium 142 135 - 145 mmol/L   Potassium 4.1 3.5 - 5.1 mmol/L  Chloride 106 101 - 111 mmol/L   BUN 15 6 - 20 mg/dL   Creatinine, Ser 1.61 0.61 - 1.24 mg/dL   Glucose, Bld 85 65 - 99 mg/dL   Calcium, Ion 0.96 (H) 1.12 - 1.23 mmol/L   TCO2 25 0 - 100 mmol/L   Hemoglobin 16.7 13.0 - 17.0 g/dL   HCT 04.5 40.9 - 81.1 %    Imaging Review No results found.   MDM   1. Essential hypertension   2. Moderate cigarette smoker   3. Hypothyroidism, unspecified hypothyroidism type    You must get established with PCP for regular management of your chronic health needs(thyroid  disease/hypertension). With noncompliance(not taking your medication daily/as directed) you are placing your health and CDL at risk. Discussed increased BP/cardiovascular effects with smoking and offered smoking cessation possibilities. Your TSH lab will not be back for several days, we will contact you if medication adjustments are necessary-in the meantime continue prescribed regimen  of 125 mcg daily. You have refills on your meds-please get them- you have a 1 month supply in which you should see a PCP for further management of medications-several possibilities of providers were given to you-please call them to schedule an appt. Return as needed. Pt verbalized understanding to this provider.   Clancy Gourd, NP 01/23/15 1547

## 2015-01-23 NOTE — Discharge Instructions (Signed)
You must get established with PCP for regular management of your chronic health needs(thyroid disease/hypertension). With noncompliance(not taking your medication daily/as directed) you are placing your health and CDL at risk. Your TSH lab will not be back for several days, we will contact you if medication adjustments are necessary-in the meantime continue prescribed regimen  of 125 mcg daily. You have refills on your meds-please get them- you have a 1 month supply in which you should see a PCP for further management of medications-several possibilities of providers were given to you-please call them to schedule an appt. Return as needed.

## 2015-01-23 NOTE — ED Notes (Signed)
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

## 2015-03-20 NOTE — ED Notes (Signed)
Call from patient, asking to review his lab results. After verifying ID, discussed elevated thyroid reports. Was advised to make an appointment to see GP of choice for medical management. Patient seemed agreeable w plan .

## 2017-09-08 ENCOUNTER — Encounter: Payer: Self-pay | Admitting: Specialist

## 2019-09-08 ENCOUNTER — Encounter (HOSPITAL_COMMUNITY): Payer: Self-pay | Admitting: Emergency Medicine

## 2019-09-08 ENCOUNTER — Other Ambulatory Visit: Payer: Self-pay

## 2019-09-08 ENCOUNTER — Emergency Department (HOSPITAL_COMMUNITY): Payer: Self-pay

## 2019-09-08 ENCOUNTER — Emergency Department (HOSPITAL_COMMUNITY)
Admission: EM | Admit: 2019-09-08 | Discharge: 2019-09-08 | Disposition: A | Discharge status: Home / Self Care | Payer: Self-pay | Attending: Emergency Medicine | Admitting: Emergency Medicine

## 2019-09-08 DIAGNOSIS — I10 Essential (primary) hypertension: Secondary | ICD-10-CM | POA: Insufficient documentation

## 2019-09-08 DIAGNOSIS — R42 Dizziness and giddiness: Secondary | ICD-10-CM | POA: Insufficient documentation

## 2019-09-08 DIAGNOSIS — Z79899 Other long term (current) drug therapy: Secondary | ICD-10-CM | POA: Insufficient documentation

## 2019-09-08 DIAGNOSIS — E079 Disorder of thyroid, unspecified: Secondary | ICD-10-CM | POA: Insufficient documentation

## 2019-09-08 DIAGNOSIS — R0602 Shortness of breath: Secondary | ICD-10-CM | POA: Insufficient documentation

## 2019-09-08 DIAGNOSIS — F17210 Nicotine dependence, cigarettes, uncomplicated: Secondary | ICD-10-CM | POA: Insufficient documentation

## 2019-09-08 LAB — BASIC METABOLIC PANEL
Anion gap: 9 (ref 5–15)
BUN: 18 mg/dL (ref 6–20)
CO2: 24 mmol/L (ref 22–32)
Calcium: 9.6 mg/dL (ref 8.9–10.3)
Chloride: 106 mmol/L (ref 98–111)
Creatinine, Ser: 1.12 mg/dL (ref 0.61–1.24)
GFR calc Af Amer: >60 mL/min (ref >60)
GFR calc non Af Amer: >60 mL/min (ref >60)
Glucose, Bld: 106 mg/dL — ABNORMAL HIGH (ref 70–99)
Potassium: 3.9 mmol/L (ref 3.5–5.1)
Sodium: 139 mmol/L (ref 135–145)

## 2019-09-08 MED ORDER — LISINOPRIL 20 MG PO TABS: 20.0000 mg | ORAL_TABLET | Freq: Every day | ORAL | 1 refills | Status: DC

## 2019-09-08 MED ORDER — LISINOPRIL 20 MG PO TABS
20.0000 mg | ORAL_TABLET | Freq: Once | ORAL | Status: AC
  Administered 2019-09-08: 20 mg via ORAL
  Filled 2019-09-08: qty 1

## 2019-09-08 NOTE — Discharge Instructions (Addendum)
He was seen in the ER for elevated blood pressure. Our work-up in the ER is reassuring. Would like you to follow-up with your primary care doctor.  It is possible that he might be developing lung disease because of your smoking.  We have prescribed you the lisinopril.

## 2019-09-08 NOTE — ED Provider Notes (Signed)
[facility] DEPT Provider Note   CSN: [id number] Arrival date & time: 09/08/19  0036     History Chief Complaint  Patient presents with  . Hypertension    Troy Newton is a 59 y.o. male.  HPI     59 year old with history of elevated blood pressure comes in a chief complaint of high blood pressure.  Patient reports that he has been out of his medications for about a week now.  He had an episode of shortness of breath after he was smoking.  He also felt dizzy at that time and that scared him.  He had no associated chest pain, palpitations.  He has noted occasional shortness of breath in the past as well.   Pt has no hx of PE, DVT and denies any exogenous hormone (testosterone / estrogen) use, long distance travels or surgery in the past 6 weeks, active cancer, recent immobilization.  Patient denies any known underlying lung disease.  He smokes more than a pack a day and has been doing it since his 51s.  Patient denies any headaches, chest pain, shortness of breath  Past Medical History:  Diagnosis Date  . Hypertension   . Thyroid disease     There are no problems to display for this patient.   Past Surgical History:  Procedure Laterality Date  . EYE SURGERY         No family history on file.  Social History   Tobacco Use  . Smoking status: Current Every Day Smoker    Packs/day: 1.00    Types: Cigarettes  . Smokeless tobacco: Never Used  Substance Use Topics  . Alcohol use: No  . Drug use: No    Home Medications Prior to Admission medications   Medication Sig Start Date End Date Taking? Authorizing Provider  levothyroxine (SYNTHROID, LEVOTHROID) 125 MCG tablet Take 1 tablet (125 mcg total) by mouth daily. 11/19/14  Yes Harris, Abigail, PA-C  metoprolol (LOPRESSOR) 50 MG tablet Take 1 tablet (50 mg total) by mouth 2 (two) times daily. 11/19/14  Yes Harris, Abigail, PA-C  lisinopril (ZESTRIL) 20 MG tablet Take 1 tablet (20 mg  total) by mouth daily. 09/08/19   Varney Biles, MD  naproxen sodium (ANAPROX) 220 MG tablet Take 220 mg by mouth 2 (two) times daily as needed (pain). For pain    [provider]  Pseudoeph-Doxylamine-DM-APAP (NYQUIL PO) Take 30 mLs by mouth daily as needed (sleep).    [provider]    Allergies    Patient has no known allergies.  Review of Systems   Review of Systems  Constitutional: Positive for activity change.  Eyes: Negative for visual disturbance.  Respiratory: Positive for shortness of breath. Negative for cough and wheezing.   Cardiovascular: Negative for chest pain.  Gastrointestinal: Negative for nausea and vomiting.  Neurological: Positive for dizziness.  All other systems reviewed and are negative.   Physical Exam Updated Vital Signs BP (!) 187/118 (BP Location: Right Arm)   Pulse 85   Temp 97.9 F (36.6 C) (Oral)   Resp 18   Ht 5\' 5"  (1.651 m)   Wt 72.6 kg   SpO2 98%   BMI 26.63 kg/m   Physical Exam Vitals and nursing note reviewed.  Constitutional:      Appearance: He is well-developed.  HENT:     Head: Atraumatic.  Eyes:     Extraocular Movements: Extraocular movements intact.     Pupils: Pupils are equal, round, and reactive  to light.  Cardiovascular:     Rate and Rhythm: Normal rate.  Pulmonary:     Effort: Pulmonary effort is normal.  Musculoskeletal:     Cervical back: Neck supple.  Skin:    General: Skin is warm.  Neurological:     Mental Status: He is alert and oriented to person, place, and time.     Cranial Nerves: No cranial nerve deficit.     Sensory: No sensory deficit.     Motor: No weakness.     ED Results / Procedures / Treatments   Labs (all labs ordered are listed, but only abnormal results are displayed) Labs Reviewed  BASIC METABOLIC PANEL - Abnormal; Notable for the following components:      Result Value   Glucose, Bld 106 (*)    All other components within normal limits    EKG EKG  Interpretation  Date/Time:  Thursday September 08 2019 01:58:17 EDT Ventricular Rate:  74 PR Interval:    QRS Duration: 90 QT Interval:  373 QTC Calculation: 414 R Axis:   74 Text Interpretation: Sinus rhythm No acute changes No significant change since last tracing Confirmed by Derwood Kaplan [phone]) on 09/08/2019 2:04:57 AM   Radiology DG Chest 2 View  Result Date: 09/08/2019 CLINICAL DATA:  Shortness of breath EXAM: CHEST - 2 VIEW COMPARISON:  08/23/2004 FINDINGS: The heart size and mediastinal contours are within normal limits. Both lungs are clear. The visualized skeletal structures are unremarkable. IMPRESSION: No active cardiopulmonary disease. Electronically Signed   By: Deatra Robinson M.D.   On: 09/08/2019 02:11    Procedures Procedures (including critical care time)  Medications Ordered in ED Medications  lisinopril (ZESTRIL) tablet 20 mg (has no administration in time range)    ED Course  I have reviewed the triage vital signs and the nursing notes.  Pertinent labs & imaging results that were available during my care of the patient were reviewed by me and considered in my medical decision making (see chart for details).    MDM Rules/Calculators/A&P                     59 year old comes in a chief complaint of elevated blood pressure.  He had an episode of shortness of breath and dizziness.  He has been out of his lisinopril for last week or so.  He is noted to be hypertensive.  He has no focal neurologic complaints or deficit.  He does not have any chest pain or shortness of breath.  Given his elevated blood pressure and the thought of starting him back on lisinopril, we did check his basic metabolic profile which does not show any AKI.  His EKG and chest x-ray are also reassuring.  I suspect that he could be having some smoking-related lung disease.  Advised PCP follow-up.  The patient was counseled on the dangers of tobacco use, and was advised to quit.  Reviewed  strategies to maximize success, including removing cigarettes and smoking materials from environment and stress management. Discussion 2-3 min.   Final Clinical Impression(s) / ED Diagnoses Final diagnoses:  Essential hypertension    Rx / DC Orders ED Discharge Orders         Ordered    lisinopril (ZESTRIL) 20 MG tablet  Daily     09/08/19 0239           Derwood Kaplan, MD 09/08/19 [phone]

## 2019-09-08 NOTE — ED Triage Notes (Signed)
Patient complaining of HTN after being out of his lisinopril for a few days. Patient also takes 2 other medications, but has been compliant with them. Patient states he felt like he had to catch his breath earlier after smoking a cigarettes, but is not currently short of breath. No chest pain.

## 2019-09-08 NOTE — ED Notes (Signed)
Patient transported to x-ray. ?

## 2023-12-30 ENCOUNTER — Encounter: Payer: Self-pay | Admitting: Gastroenterology

## 2024-01-14 ENCOUNTER — Ambulatory Visit (AMBULATORY_SURGERY_CENTER): Admitting: *Deleted

## 2024-01-14 VITALS — Ht 65.0 in | Wt 170.0 lb

## 2024-01-14 DIAGNOSIS — Z1211 Encounter for screening for malignant neoplasm of colon: Secondary | ICD-10-CM

## 2024-01-14 MED ORDER — NA SULFATE-K SULFATE-MG SULF 17.5-3.13-1.6 GM/177ML PO SOLN: 1.0000 | Freq: Once | ORAL | 0 refills | Status: AC

## 2024-01-14 NOTE — Progress Notes (Signed)
 Pt's name and DOB verified at the beginning of the pre-visit with 2 identifiers  Pt denies any difficulty with ambulating,sitting, laying down or rolling side to side   Pt uses ambulation assistance device or has issues with mobiiity  Pt has no issues moving head neck or swallowing  No egg or soy allergy known to patient   No issues known to pt with past sedation with any surgeries or procedures  Patient denies ever being intubated  No FH of Malignant Hyperthermia  Pt is not on home 02   Pt is not on blood thinners   Pt denies issues with constipation   Pt is not on dialysis  Pt denise any abnormal heart rhythms   Pt denies any upcoming cardiac testing  Patient's chart reviewed by Norleen Schillings CNRA prior to pre-visit and patient appropriate for the [facility].  Pre-visit completed and red dot placed by patient's name on their procedure day (on provider's schedule).    Visit by phone  Pt states weight is 170 lb  IInstructions reviewed. Pt given  both [facility] main # and MD on call # prior to instructions. Instructions reviewed. Pt given  both [facility] main # and MD on call # prior to instructions.   Informed pt to come in at the time discussed and is shown on PV instructions. Pt informed that they are coming in i.e. cloths change.,IV placement , Consent signing and meeting CRNA. Pt states understanding after  given opportunity to ask questions t after all  instructions given. Instructed pt to review instructions again prior to procedure and call main # given if has any questions.. Pt states they will.  Instructed pt where to find PV instructions in My Chart

## 2024-01-28 ENCOUNTER — Encounter: Admitting: Gastroenterology
